# Patient Record
Sex: Female | Born: 1960 | Race: White | Hispanic: No | Marital: Single | Smoking: Current every day smoker
Health system: Southern US, Community
[De-identification: ages and names within clinical notes are randomized; demographics above are authoritative.]

## PROBLEM LIST (undated history)

## (undated) ENCOUNTER — Ambulatory Visit: Payer: Self-pay

## (undated) DIAGNOSIS — J439 Emphysema, unspecified: Secondary | ICD-10-CM

## (undated) DIAGNOSIS — I341 Nonrheumatic mitral (valve) prolapse: Secondary | ICD-10-CM

## (undated) DIAGNOSIS — I10 Essential (primary) hypertension: Secondary | ICD-10-CM

## (undated) HISTORY — PX: SHOULDER ARTHROSCOPY: SHX128

## (undated) HISTORY — PX: ABDOMINAL HYSTERECTOMY: SHX81

## (undated) HISTORY — PX: NECK SURGERY: SHX720

---

## 2014-08-18 ENCOUNTER — Emergency Department: Payer: Self-pay | Admitting: Student

## 2014-11-15 ENCOUNTER — Emergency Department: Payer: Self-pay | Admitting: Emergency Medicine

## 2014-12-15 ENCOUNTER — Emergency Department: Payer: Self-pay | Admitting: Emergency Medicine

## 2014-12-15 LAB — CBC
HCT: 42.2 % (ref 35.0–47.0)
HGB: 14.4 g/dL (ref 12.0–16.0)
MCH: 31.5 pg (ref 26.0–34.0)
MCHC: 34 g/dL (ref 32.0–36.0)
MCV: 93 fL (ref 80–100)
PLATELETS: 223 10*3/uL (ref 150–440)
RBC: 4.55 10*6/uL (ref 3.80–5.20)
RDW: 12.7 % (ref 11.5–14.5)
WBC: 13.9 10*3/uL — ABNORMAL HIGH (ref 3.6–11.0)

## 2014-12-15 LAB — COMPREHENSIVE METABOLIC PANEL
ALT: 22 U/L (ref 14–63)
Albumin: 4 g/dL (ref 3.4–5.0)
Alkaline Phosphatase: 54 U/L (ref 46–116)
Anion Gap: 8 (ref 7–16)
BILIRUBIN TOTAL: 0.4 mg/dL (ref 0.2–1.0)
BUN: 17 mg/dL (ref 7–18)
CHLORIDE: 106 mmol/L (ref 98–107)
CREATININE: 0.69 mg/dL (ref 0.60–1.30)
Calcium, Total: 9.4 mg/dL (ref 8.5–10.1)
Co2: 26 mmol/L (ref 21–32)
EGFR (Non-African Amer.): >60
Glucose: 100 mg/dL — ABNORMAL HIGH (ref 65–99)
OSMOLALITY: 281 (ref 275–301)
Potassium: 3.8 mmol/L (ref 3.5–5.1)
SGOT(AST): 14 U/L — ABNORMAL LOW (ref 15–37)
Sodium: 140 mmol/L (ref 136–145)
Total Protein: 8.3 g/dL — ABNORMAL HIGH (ref 6.4–8.2)

## 2014-12-15 LAB — TROPONIN I: Troponin-I: <0.02 ng/mL

## 2014-12-15 LAB — D-DIMER(ARMC): D-DIMER: 276 ng/mL

## 2015-06-20 ENCOUNTER — Encounter: Payer: Self-pay | Admitting: Emergency Medicine

## 2015-06-20 ENCOUNTER — Emergency Department
Admission: EM | Admit: 2015-06-20 | Discharge: 2015-06-20 | Disposition: A | Discharge status: Home / Self Care | Payer: Self-pay | Attending: Emergency Medicine | Admitting: Emergency Medicine

## 2015-06-20 DIAGNOSIS — Y998 Other external cause status: Secondary | ICD-10-CM | POA: Insufficient documentation

## 2015-06-20 DIAGNOSIS — Z720 Tobacco use: Secondary | ICD-10-CM | POA: Insufficient documentation

## 2015-06-20 DIAGNOSIS — X58XXXA Exposure to other specified factors, initial encounter: Secondary | ICD-10-CM | POA: Insufficient documentation

## 2015-06-20 DIAGNOSIS — R110 Nausea: Secondary | ICD-10-CM | POA: Insufficient documentation

## 2015-06-20 DIAGNOSIS — Y9389 Activity, other specified: Secondary | ICD-10-CM | POA: Insufficient documentation

## 2015-06-20 DIAGNOSIS — R51 Headache: Secondary | ICD-10-CM | POA: Insufficient documentation

## 2015-06-20 DIAGNOSIS — Y9289 Other specified places as the place of occurrence of the external cause: Secondary | ICD-10-CM | POA: Insufficient documentation

## 2015-06-20 DIAGNOSIS — S161XXA Strain of muscle, fascia and tendon at neck level, initial encounter: Secondary | ICD-10-CM | POA: Insufficient documentation

## 2015-06-20 HISTORY — DX: Nonrheumatic mitral (valve) prolapse: I34.1

## 2015-06-20 HISTORY — DX: Emphysema, unspecified: J43.9

## 2015-06-20 MED ORDER — KETOROLAC TROMETHAMINE 60 MG/2ML IM SOLN
60.0000 mg | Freq: Once | INTRAMUSCULAR | Status: AC
  Administered 2015-06-20: 60 mg via INTRAMUSCULAR
  Filled 2015-06-20: qty 2

## 2015-06-20 MED ORDER — DIAZEPAM 5 MG/ML IJ SOLN
5.0000 mg | Freq: Once | INTRAMUSCULAR | Status: AC
  Administered 2015-06-20: 5 mg via INTRAMUSCULAR
  Filled 2015-06-20: qty 2

## 2015-06-20 MED ORDER — PROMETHAZINE HCL 25 MG/ML IJ SOLN: 12.5000 mg | Freq: Once | INTRAMUSCULAR | Status: DC

## 2015-06-20 MED ORDER — IBUPROFEN 800 MG PO TABS: 800.0000 mg | ORAL_TABLET | Freq: Three times a day (TID) | ORAL | Status: AC | PRN

## 2015-06-20 MED ORDER — MEPERIDINE HCL 25 MG/ML IJ SOLN: 25.0000 mg | Freq: Once | INTRAMUSCULAR | Status: DC

## 2015-06-20 MED ORDER — CYCLOBENZAPRINE HCL 10 MG PO TABS: 10.0000 mg | ORAL_TABLET | Freq: Three times a day (TID) | ORAL | Status: DC | PRN

## 2015-06-20 NOTE — ED Notes (Signed)
Patient has a history of neck surgery and gets frequent massages by family.  Patient states she may have slept on it wrong but has had increased pain.  Difficulty moving neck to the right side.  Nausea and headache also.  No vision changes.

## 2015-06-20 NOTE — ED Provider Notes (Signed)
[facility] Emergency Department Provider Note  ____________________________________________  Time seen: Approximately 4:10 PM  I have reviewed the triage vital signs and the nursing notes.   HISTORY  Chief Complaint Neck Pain   HPI Sylvia Kirk is a 54 y.o. female resents for evaluation of severe neck pain. Patient states that she thinks that she slept wrong woke up this morning with pain to the right side. Also complains of nausea and headache. Denies any visual changes. Past medical history significant for cervical spinal fusion.   Past Medical History  Diagnosis Date  . Emphysema lung   . Mitral valve prolapse     There are no active problems to display for this patient.   Past Surgical History  Procedure Laterality Date  . Neck surgery    . Shoulder arthroscopy      right  . Abdominal hysterectomy      Current Outpatient Rx  Name  Route  Sig  Dispense  Refill  . cyclobenzaprine (FLEXERIL) 10 MG tablet   Oral   Take 1 tablet (10 mg total) by mouth every 8 (eight) hours as needed for muscle spasms.   30 tablet   1   . ibuprofen (ADVIL,MOTRIN) 800 MG tablet   Oral   Take 1 tablet (800 mg total) by mouth every 8 (eight) hours as needed.   30 tablet   0     Allergies Codeine  History reviewed. No pertinent family history.  Social History History  Substance Use Topics  . Smoking status: Current Every Day Smoker -- 0.50 packs/day    Types: Cigarettes  . Smokeless tobacco: Never Used  . Alcohol Use: No    Review of Systems Constitutional: No fever/chills Eyes: No visual changes. ENT: No sore throat. Cardiovascular: Denies chest pain. Respiratory: Denies shortness of breath. Gastrointestinal: No abdominal pain.  No nausea, no vomiting.  No diarrhea.  No constipation. Genitourinary: Negative for dysuria. Musculoskeletal: Positive for cervical spinal neck pain. Skin: Negative for rash. Neurological: Negative for  headaches, focal weakness or numbness.  10-point ROS otherwise negative.  ____________________________________________   PHYSICAL EXAM:  VITAL SIGNS: ED Triage Vitals  Enc Vitals Group     BP 06/20/15 1552 141/84 mmHg     Pulse Rate 06/20/15 1552 72     Resp 06/20/15 1552 18     Temp 06/20/15 1552 98.1 F (36.7 C)     Temp Source 06/20/15 1552 Oral     SpO2 06/20/15 1552 99 %     Weight 06/20/15 1552 135 lb (61.236 kg)     Height 06/20/15 1552  (1.727 m)     Head Cir --      Peak Flow --      Pain Score 06/20/15 1551 10     Pain Loc --      Pain Edu? --      Excl. in GC? --     Constitutional: Alert and oriented. Well appearing and in no acute distress. Mouth/Throat: Mucous membranes are moist.  Oropharynx non-erythematous. Neck: Positive cervical paraspinal muscle tenderness with limited range of motion. Able to lateralize head but unable to flex or extend.   Cardiovascular: Normal rate, regular rhythm. Grossly normal heart sounds.  Good peripheral circulation. Respiratory: Normal respiratory effort.  No retractions. Lungs CTAB. Musculoskeletal: No lower extremity tenderness nor edema.  No joint effusions. Neurologic:  Normal speech and language. No gross focal neurologic deficits are appreciated. No gait instability. Skin:  Skin is warm,  dry and intact. No rash noted. Psychiatric: Mood and affect are normal. Speech and behavior are normal.  ____________________________________________   LABS (all labs ordered are listed, but only abnormal results are displayed)  Labs Reviewed - No data to display ____________________________________________   PROCEDURES  Procedure(s) performed: None  Critical Care performed: No  ____________________________________________   INITIAL IMPRESSION / ASSESSMENT AND PLAN / ED COURSE  Pertinent labs & imaging results that were available during my care of the patient were reviewed by me and considered in my medical decision  making (see chart for details).  History cervical spinal fusion with paraspinal muscle tenderness today. Toradol 60 Valium 5 mg IM given Rx given for Flexeril 10 mg 3 times a day and Motrin 800 mg 3 times a day. Patient follow-up with PCP as needed or return to the ER. ____________________________________________   FINAL CLINICAL IMPRESSION(S) / ED DIAGNOSES  Final diagnoses:  Cervical strain, acute, initial encounter      Evangeline Dakin, PA-C 06/20/15 1648  Maurilio Lovely, MD 06/21/15 0005

## 2015-06-20 NOTE — Discharge Instructions (Signed)

## 2015-09-28 ENCOUNTER — Emergency Department: Payer: Self-pay

## 2015-09-28 ENCOUNTER — Encounter: Payer: Self-pay | Admitting: Emergency Medicine

## 2015-09-28 ENCOUNTER — Emergency Department
Admission: EM | Admit: 2015-09-28 | Discharge: 2015-09-28 | Disposition: A | Discharge status: Home / Self Care | Payer: Self-pay | Attending: Emergency Medicine | Admitting: Emergency Medicine

## 2015-09-28 DIAGNOSIS — J209 Acute bronchitis, unspecified: Secondary | ICD-10-CM | POA: Insufficient documentation

## 2015-09-28 DIAGNOSIS — F17210 Nicotine dependence, cigarettes, uncomplicated: Secondary | ICD-10-CM | POA: Insufficient documentation

## 2015-09-28 DIAGNOSIS — J069 Acute upper respiratory infection, unspecified: Secondary | ICD-10-CM | POA: Insufficient documentation

## 2015-09-28 DIAGNOSIS — J40 Bronchitis, not specified as acute or chronic: Secondary | ICD-10-CM

## 2015-09-28 MED ORDER — AZITHROMYCIN 250 MG PO TABS: 250.0000 mg | ORAL_TABLET | Freq: Every day | ORAL | Status: AC

## 2015-09-28 MED ORDER — ALBUTEROL SULFATE HFA 108 (90 BASE) MCG/ACT IN AERS: 2.0000 | INHALATION_SPRAY | Freq: Four times a day (QID) | RESPIRATORY_TRACT | Status: AC | PRN

## 2015-09-28 MED ORDER — AZITHROMYCIN 250 MG PO TABS
500.0000 mg | ORAL_TABLET | Freq: Once | ORAL | Status: AC
  Administered 2015-09-28: 500 mg via ORAL
  Filled 2015-09-28: qty 2

## 2015-09-28 MED ORDER — DEXAMETHASONE 4 MG PO TABS
8.0000 mg | ORAL_TABLET | Freq: Once | ORAL | Status: AC
  Administered 2015-09-28: 8 mg via ORAL
  Filled 2015-09-28: qty 2

## 2015-09-28 NOTE — ED Notes (Signed)
Pt in today reporting productive cough, fever x 3 weeks; states symptoms got better and then worse again.  Pt reports taking mucinex. Pt A/Ox4, no acute distress at this time. MD at bedside.

## 2015-09-28 NOTE — Discharge Instructions (Signed)
Upper Respiratory Infection, Adult Most upper respiratory infections (URIs) are a viral infection of the air passages leading to the lungs. A URI affects the nose, throat, and upper air passages. The most common type of URI is nasopharyngitis and is typically referred to as "the common cold." URIs run their course and usually go away on their own. Most of the time, a URI does not require medical attention, but sometimes a bacterial infection in the upper airways can follow a viral infection. This is called a secondary infection. Sinus and middle ear infections are common types of secondary upper respiratory infections. Bacterial pneumonia can also complicate a URI. A URI can worsen asthma and chronic obstructive pulmonary disease (COPD). Sometimes, these complications can require emergency medical care and may be life threatening.  CAUSES Almost all URIs are caused by viruses. A virus is a type of germ and can spread from one person to another.  RISKS FACTORS You may be at risk for a URI if:   You smoke.   You have chronic heart or lung disease.  You have a weakened defense (immune) system.   You are very young or very old.   You have nasal allergies or asthma.  You work in crowded or poorly ventilated areas.  You work in health care facilities or schools. SIGNS AND SYMPTOMS  Symptoms typically develop 2-3 days after you come in contact with a cold virus. Most viral URIs last 7-10 days. However, viral URIs from the influenza virus (flu virus) can last 14-18 days and are typically more severe. Symptoms may include:   Runny or stuffy (congested) nose.   Sneezing.   Cough.   Sore throat.   Headache.   Fatigue.   Fever.   Loss of appetite.   Pain in your forehead, behind your eyes, and over your cheekbones (sinus pain).  Muscle aches.  DIAGNOSIS  Your health care provider may diagnose a URI by:  Physical exam.  Tests to check that your symptoms are not due to  another condition such as:  Strep throat.  Sinusitis.  Pneumonia.  Asthma. TREATMENT  A URI goes away on its own with time. It cannot be cured with medicines, but medicines may be prescribed or recommended to relieve symptoms. Medicines may help:  Reduce your fever.  Reduce your cough.  Relieve nasal congestion. HOME CARE INSTRUCTIONS   Take medicines only as directed by your health care provider.   Gargle warm saltwater or take cough drops to comfort your throat as directed by your health care provider.  Use a warm mist humidifier or inhale steam from a shower to increase air moisture. This may make it easier to breathe.  Drink enough fluid to keep your urine clear or pale yellow.   Eat soups and other clear broths and maintain good nutrition.   Rest as needed.   Return to work when your temperature has returned to normal or as your health care provider advises. You may need to stay home longer to avoid infecting others. You can also use a face mask and careful hand washing to prevent spread of the virus.  Increase the usage of your inhaler if you have asthma.   Do not use any tobacco products, including cigarettes, chewing tobacco, or electronic cigarettes. If you need help quitting, ask your health care provider. PREVENTION  The best way to protect yourself from getting a cold is to practice good hygiene.   Avoid oral or hand contact with people with cold   symptoms.   Wash your hands often if contact occurs.  There is no clear evidence that vitamin C, vitamin E, echinacea, or exercise reduces the chance of developing a cold. However, it is always recommended to get plenty of rest, exercise, and practice good nutrition.  SEEK MEDICAL CARE IF:   You are getting worse rather than better.   Your symptoms are not controlled by medicine.   You have chills.  You have worsening shortness of breath.  You have brown or red mucus.  You have yellow or brown nasal  discharge.  You have pain in your face, especially when you bend forward.  You have a fever.  You have swollen neck glands.  You have pain while swallowing.  You have white areas in the back of your throat. SEEK IMMEDIATE MEDICAL CARE IF:   You have severe or persistent:  Headache.  Ear pain.  Sinus pain.  Chest pain.  You have chronic lung disease and any of the following:  Wheezing.  Prolonged cough.  Coughing up blood.  A change in your usual mucus.  You have a stiff neck.  You have changes in your:  Vision.  Hearing.  Thinking.  Mood. MAKE SURE YOU:   Understand these instructions.  Will watch your condition.  Will get help right away if you are not doing well or get worse.   This information is not intended to replace advice given to you by your health care provider. Make sure you discuss any questions you have with your health care provider.   Document Released: 04/24/2001 Document Revised: 03/15/2015 Document Reviewed: 02/03/2014 Elsevier Interactive Patient Education 2016 [employer]  

## 2015-09-28 NOTE — ED Provider Notes (Signed)
[facility] Emergency Department Provider Note  Time seen: 10:11 AM  I have reviewed the triage vital signs and the nursing notes.   HISTORY  Chief Complaint Cough and Chest Pain    HPI Geneen Nahjae Hoeg is a 54 y.o. female with a past medical history of emphysema presents to the emergency department with cough, congestion, subjective fever 3 weeks. According to the patient for the past 3 weeks she has intermittent cough, congestion, with subjective fevers mostly at night. States the cough has been nonstop. She is taking Mucinex without relief. States occasional chest pain with her cough but none at rest. Has not measured a temperature at home. States her throat is hurting from coughing. Has nasal congestion.Describes her symptoms as moderate.    Past Medical History  Diagnosis Date  . Emphysema lung (HCC)   . Mitral valve prolapse     There are no active problems to display for this patient.   Past Surgical History  Procedure Laterality Date  . Neck surgery    . Shoulder arthroscopy      right  . Abdominal hysterectomy      Current Outpatient Rx  Name  Route  Sig  Dispense  Refill  . cyclobenzaprine (FLEXERIL) 10 MG tablet   Oral   Take 1 tablet (10 mg total) by mouth every 8 (eight) hours as needed for muscle spasms.   30 tablet   1   . ibuprofen (ADVIL,MOTRIN) 800 MG tablet   Oral   Take 1 tablet (800 mg total) by mouth every 8 (eight) hours as needed.   30 tablet   0     Allergies Codeine  No family history on file.  Social History Social History  Substance Use Topics  . Smoking status: Current Every Day Smoker -- 0.50 packs/day    Types: Cigarettes  . Smokeless tobacco: Never Used  . Alcohol Use: No    Review of Systems Constitutional: Subjective fever. Positive nasal congestion. Cardiovascular: Occasional chest pain with coughing spells. Respiratory: Negative for shortness of breath. Positive  cough. Gastrointestinal: Negative for abdominal pain Musculoskeletal: Negative for back pain Neurological: Negative for headache 10-point ROS otherwise negative.  ____________________________________________   PHYSICAL EXAM:  VITAL SIGNS: ED Triage Vitals  Enc Vitals Group     BP 09/28/15 0957 154/92 mmHg     Pulse Rate 09/28/15 0957 86     Resp 09/28/15 0957 18     Temp 09/28/15 0957 98.2 F (36.8 C)     Temp Source 09/28/15 0957 Oral     SpO2 09/28/15 0957 97 %     Weight 09/28/15 0957 130 lb (58.968 kg)     Height 09/28/15 0957  (1.727 m)     Head Cir --      Peak Flow --      Pain Score 09/28/15 0957 9     Pain Loc --      Pain Edu? --      Excl. in GC? --     Constitutional: Alert and oriented. Well appearing and in no distress. Eyes: Normal exam ENT   Head: Normocephalic and atraumatic.   Nose: Mild rhinorrhea. Normal tympanic membranes.   Mouth/Throat: Mucous membranes are moist. No pharyngeal erythema or exudate. Cardiovascular: Normal rate, regular rhythm. No murmurs, rubs, or gallops. Respiratory: Normal respiratory effort without tachypnea nor retractions. Breath sounds are clear and equal bilaterally. No wheezes/rales/rhonchi. Gastrointestinal: Soft and nontender. No distention.   Musculoskeletal: Nontender with normal  range of motion in all extremities Neurologic:  Normal speech and language. No gross focal neurologic deficits Skin:  Skin is warm, dry and intact.  Psychiatric: Mood and affect are normal. Speech and behavior are normal.  ____________________________________________      RADIOLOGY  Bronchitic changes  ____________________________________________    INITIAL IMPRESSION / ASSESSMENT AND PLAN / ED COURSE  Pertinent labs & imaging results that were available during my care of the patient were reviewed by me and considered in my medical decision making (see chart for details).  Patient presents with 3 weeks of  intermittent cough, congestion, sore throat. Symptoms most suggestive of bronchitis given the patient's persistent dry cough, with history of emphysema. No wheeze currently. We'll obtain a chest x-ray to rule out pneumonia. We will treat with Decadron (patient cannot afford prednisone prescription per patient). We will also prescribe Zithromax. We will prescribe albuterol, however the patient states she'll not be able to afford albuterol until Thursday. Overall the patient appears very well, nontoxic. No concern for ACS. Patient is suffering from an upper respiratory infection.  Chest x-ray shows bronchitic changes, otherwise no acute abnormality. We'll discharge on Zithromax and have the patient follow up with a primary care physician.  ____________________________________________   FINAL CLINICAL IMPRESSION(S) / ED DIAGNOSES  Upper respiratory infection   Minna AntisKevin Kallon Caylor, MD 09/28/15 1058

## 2015-09-28 NOTE — ED Notes (Signed)
Says she has had cough, copd and her chst hurts from coughing and beind sick so longl

## 2015-10-03 ENCOUNTER — Ambulatory Visit: Payer: Self-pay | Admitting: Unknown Physician Specialty

## 2016-03-18 ENCOUNTER — Emergency Department
Admission: EM | Admit: 2016-03-18 | Discharge: 2016-03-18 | Disposition: A | Discharge status: Home / Self Care | Payer: Self-pay | Attending: Emergency Medicine | Admitting: Emergency Medicine

## 2016-03-18 ENCOUNTER — Encounter: Payer: Self-pay | Admitting: *Deleted

## 2016-03-18 DIAGNOSIS — I10 Essential (primary) hypertension: Secondary | ICD-10-CM | POA: Insufficient documentation

## 2016-03-18 DIAGNOSIS — M62838 Other muscle spasm: Secondary | ICD-10-CM | POA: Insufficient documentation

## 2016-03-18 DIAGNOSIS — M25512 Pain in left shoulder: Secondary | ICD-10-CM | POA: Insufficient documentation

## 2016-03-18 DIAGNOSIS — M7582 Other shoulder lesions, left shoulder: Secondary | ICD-10-CM | POA: Insufficient documentation

## 2016-03-18 DIAGNOSIS — G8929 Other chronic pain: Secondary | ICD-10-CM | POA: Insufficient documentation

## 2016-03-18 DIAGNOSIS — F17210 Nicotine dependence, cigarettes, uncomplicated: Secondary | ICD-10-CM | POA: Insufficient documentation

## 2016-03-18 HISTORY — DX: Essential (primary) hypertension: I10

## 2016-03-18 MED ORDER — PREDNISONE 10 MG PO TABS: 10.0000 mg | ORAL_TABLET | Freq: Two times a day (BID) | ORAL | Status: AC

## 2016-03-18 MED ORDER — CYCLOBENZAPRINE HCL 5 MG PO TABS: 5.0000 mg | ORAL_TABLET | Freq: Three times a day (TID) | ORAL | Status: AC | PRN

## 2016-03-18 NOTE — ED Provider Notes (Signed)
[facility] Emergency Department Provider Note ____________________________________________  Time seen: 1757  I have reviewed the triage vital signs and the nursing notes.  HISTORY  Chief Complaint  Shoulder Pain  HPI Sylvia Kirk is a 55 y.o. female visits to the ED for evaluation and management of chronic pain to the left shoulder without recent injury, accident, or trauma. The patient describes muscle spasm to the posterior shoulder and trapezius region that has been persistent for the last year and a half. Review of her EPIC chart reveals an MRI done in 2012 of the left shoulder that reveals bursitis and tendinosis of the rotator cuff without acute tear. Her more recent evaluations include a slip and fall in September 2016 when she sustained a left wrist fracture that was treated without surgical intervention. She had a subsequent nerve blocks the left upper extremity related to that injury. She has not been according to her, evaluated by any provider since that time the left shoulder. She is previously been on medications including Flexeril and meloxicam. She describes the pain as shooting in nature with decreased range of motion to the left shoulder. Denies any distal paresthesias, grip changes, or recent injury. She rates her pain as 9/10 in triage.  Past Medical History  Diagnosis Date  . Emphysema lung (HCC)   . Mitral valve prolapse   . Hypertension     There are no active problems to display for this patient.   Past Surgical History  Procedure Laterality Date  . Neck surgery    . Shoulder arthroscopy      right  . Abdominal hysterectomy      Current Outpatient Rx  Name  Route  Sig  Dispense  Refill  . albuterol (PROVENTIL HFA;VENTOLIN HFA) 108 (90 BASE) MCG/ACT inhaler   Inhalation   Inhale 2 puffs into the lungs every 6 (six) hours as needed for wheezing or shortness of breath.   1 Inhaler   2   . azithromycin (ZITHROMAX) 250 MG  tablet   Oral   Take 1 tablet (250 mg total) by mouth daily.   4 each   0   . cyclobenzaprine (FLEXERIL) 5 MG tablet   Oral   Take 1 tablet (5 mg total) by mouth every 8 (eight) hours as needed for muscle spasms.   21 tablet   0   . ibuprofen (ADVIL,MOTRIN) 800 MG tablet   Oral   Take 1 tablet (800 mg total) by mouth every 8 (eight) hours as needed.   30 tablet   0   . predniSONE (DELTASONE) 10 MG tablet   Oral   Take 1 tablet (10 mg total) by mouth 2 (two) times daily with a meal.   14 tablet   0    Allergies Codeine  History reviewed. No pertinent family history.  Social History Social History  Substance Use Topics  . Smoking status: Current Every Day Smoker -- 0.50 packs/day    Types: Cigarettes  . Smokeless tobacco: Never Used  . Alcohol Use: No   Review of Systems  Constitutional: Negative for fever. Cardiovascular: Negative for chest pain. Respiratory: Negative for shortness of breath. Musculoskeletal: Negative for back pain. Left shoulder pain as above Skin: Negative for rash. Neurological: Negative for headaches, focal weakness or numbness. ____________________________________________  PHYSICAL EXAM:  VITAL SIGNS: ED Triage Vitals  Enc Vitals Group     BP 03/18/16 1637 183/77 mmHg     Pulse Rate 03/18/16 1637 87  Resp 03/18/16 1637 16     Temp 03/18/16 1637 98.2 F (36.8 C)     Temp Source 03/18/16 1637 Oral     SpO2 03/18/16 1637 97 %     Weight 03/18/16 1637 130 lb (58.968 kg)     Height 03/18/16 1637  (1.727 m)     Head Cir --      Peak Flow --      Pain Score 03/18/16 1639 9     Pain Loc --      Pain Edu? --      Excl. in GC? --    Constitutional: Alert and oriented. Well appearing and in no distress. Head: Normocephalic and atraumatic. Cardiovascular: Normal rate, regular rhythm. Normal distal pulses and cap refill. Respiratory: Normal respiratory effort. No wheezes/rales/rhonchi. Musculoskeletal: Left shoulder without  obvious deformity, no sulcus sign, or dislocation. Patient is able to demonstrate normal active range of motion in all planes. She is noted to have a negative drop arm test, normal Apley scratch test, and normal rotator cuff testing. Nontender with normal range of motion in all other extremities.  Neurologic: Cranial nerves II through XII grossly intact. Normal UE DTRs bilaterally. Normal gait without ataxia. Normal speech and language. No gross focal neurologic deficits are appreciated. Skin:  Skin is warm, dry and intact. No rash noted. ____________________________________________  INITIAL IMPRESSION / ASSESSMENT AND PLAN / ED COURSE  Patient with chronic left shoulder pain with out recent injury, accident, trauma. She has a history of rotator cuff tendinitis/tendinosis, as well as bursitis. She is advised that she should be evaluated by orthopedics for ongoing management of her chronic shoulder pain. She is given referral to Dr. Rosita Kea as well as a referral to the local community clinics to establish a primary care home. She is discharged with prescriptions for Flexeril and Deltasone dose as directed. She is advised to apply ice to the shoulder for comfort and advised on home exercises. ____________________________________________  FINAL CLINICAL IMPRESSION(S) / ED DIAGNOSES  Final diagnoses:  Chronic shoulder pain, left  Rotator cuff tendinitis, left  Muscle spasm of left shoulder area     Lissa Hoard, PA-C 03/19/16 0981  Jennye Moccasin, MD 03/22/16 [phone]

## 2016-03-18 NOTE — ED Notes (Signed)
Patient transported to X-ray 

## 2016-03-18 NOTE — ED Notes (Signed)
Pt reports left shoulder pain without specific injury for past few months. Pt states has had steroid shots in shoulder without relief of pain. Pt reports continued worsening symptoms.

## 2016-03-18 NOTE — Discharge Instructions (Signed)
Chronic Pain Chronic pain can be defined as pain that is off and on and lasts for 3-6 months or longer. Many things cause chronic pain, which can make it difficult to make a diagnosis. There are many treatment options available for chronic pain. However, finding a treatment that works well for you may require trying various approaches until the right one is found. Many people benefit from a combination of two or more types of treatment to control their pain. SYMPTOMS  Chronic pain can occur anywhere in the body and can range from mild to very severe. Some types of chronic pain include: 1. Headache. 2. Low back pain. 3. Cancer pain. 4. Arthritis pain. 5. Neurogenic pain. This is pain resulting from damage to nerves. People with chronic pain may also have other symptoms such as: 1. Depression. 2. Anger. 3. Insomnia. 4. Anxiety. DIAGNOSIS  Your health care provider will help diagnose your condition over time. In many cases, the initial focus will be on excluding possible conditions that could be causing the pain. Depending on your symptoms, your health care provider may order tests to diagnose your condition. Some of these tests may include:  1. Blood tests.  2. CT scan.  3. MRI.  4. X-rays.  5. Ultrasounds.  6. Nerve conduction studies.  You may need to see a specialist.  TREATMENT  Finding treatment that works well may take time. You may be referred to a pain specialist. He or she may prescribe medicine or therapies, such as:  1. Mindful meditation or yoga. 2. Shots (injections) of numbing or pain-relieving medicines into the spine or area of pain. 3. Local electrical stimulation. 4. Acupuncture.  5. Massage therapy.  6. Aroma, color, light, or sound therapy.  7. Biofeedback.  8. Working with a physical therapist to keep from getting stiff.  9. Regular, gentle exercise.  10. Cognitive or behavioral therapy.  11. Group support.  Sometimes, surgery may be recommended.    HOME CARE INSTRUCTIONS  1. Take all medicines as directed by your health care provider.  2. Lessen stress in your life by relaxing and doing things such as listening to calming music.  3. Exercise or be active as directed by your health care provider.  4. Eat a healthy diet and include things such as vegetables, fruits, fish, and lean meats in your diet.  5. Keep all follow-up appointments with your health care provider.  6. Attend a support group with others suffering from chronic pain. SEEK MEDICAL CARE IF:  1. Your pain gets worse.  2. You develop a new pain that was not there before.  3. You cannot tolerate medicines given to you by your health care provider.  4. You have new symptoms since your last visit with your health care provider.  SEEK IMMEDIATE MEDICAL CARE IF:  1. You feel weak.  2. You have decreased sensation or numbness.  3. You lose control of bowel or bladder function.  4. Your pain suddenly gets much worse.  5. You develop shaking. 6. You develop chills. 7. You develop confusion. 8. You develop chest pain. 9. You develop shortness of breath.  MAKE SURE YOU:  Understand these instructions.  Will watch your condition.  Will get help right away if you are not doing well or get worse.   This information is not intended to replace advice given to you by your health care provider. Make sure you discuss any questions you have with your health care provider.   Document Released:  07/21/2002 Document Revised: 07/01/2013 Document Reviewed: 04/24/2013 Elsevier Interactive Patient Education 2016 [employer] Inc[employer]   Shoulder Pain The shoulder is the joint that connects your arms to your body. The bones that form the shoulder joint include the upper arm bone (humerus), the shoulder blade (scapula), and the collarbone (clavicle). The top of the humerus is shaped like a ball and fits into a rather flat socket on the scapula (glenoid cavity). A combination of  muscles and strong, fibrous tissues that connect muscles to bones (tendons) support your shoulder joint and hold the ball in the socket. Small, fluid-filled sacs (bursae) are located in different areas of the joint. They act as cushions between the bones and the overlying soft tissues and help reduce friction between the gliding tendons and the bone as you move your arm. Your shoulder joint allows a wide range of motion in your arm. This range of motion allows you to do things like scratch your back or throw a ball. However, this range of motion also makes your shoulder more prone to pain from overuse and injury. Causes of shoulder pain can originate from both injury and overuse and usually can be grouped in the following four categories: 6. Redness, swelling, and pain (inflammation) of the tendon (tendinitis) or the bursae (bursitis). 7. Instability, such as a dislocation of the joint. 8. Inflammation of the joint (arthritis). 9. Broken bone (fracture). HOME CARE INSTRUCTIONS  5. Apply ice to the sore area.  Put ice in a plastic bag.  Place a towel between your skin and the bag.  Leave the ice on for 15-20 minutes, 3-4 times per day for the first 2 days, or as directed by your health care provider. 6. Stop using cold packs if they do not help with the pain. 7. If you have a shoulder sling or immobilizer, wear it as long as your caregiver instructs. Only remove it to shower or bathe. Move your arm as little as possible, but keep your hand moving to prevent swelling. 8. Squeeze a soft ball or foam pad as much as possible to help prevent swelling. 9. Only take over-the-counter or prescription medicines for pain, discomfort, or fever as directed by your caregiver. SEEK MEDICAL CARE IF:  7. Your shoulder pain increases, or new pain develops in your arm, hand, or fingers. 8. Your hand or fingers become cold and numb. 9. Your pain is not relieved with medicines. SEEK IMMEDIATE MEDICAL CARE IF:   12. Your arm, hand, or fingers are numb or tingling. 13. Your arm, hand, or fingers are significantly swollen or turn white or blue. MAKE SURE YOU:  7. Understand these instructions. 8. Will watch your condition. 9. Will get help right away if you are not doing well or get worse.   This information is not intended to replace advice given to you by your health care provider. Make sure you discuss any questions you have with your health care provider.   Document Released: 08/08/2005 Document Revised: 11/19/2014 Document Reviewed: 02/21/2015 Elsevier Interactive Patient Education 2016 [employer]  Shoulder Range of Motion Exercises Shoulder range of motion (ROM) exercises are designed to keep the shoulder moving freely. They are often recommended for people who have shoulder pain. MOVEMENT EXERCISE When you are able, do this exercise 5-6 days per week, or as told by your health care provider. Work toward doing 2 sets of 10 swings. Pendulum Exercise How To Do This Exercise Lying Down 10. Lie face-down on a bed with your abdomen close  to the side of the bed. 11. Let your arm hang over the side of the bed. 12. Relax your shoulder, arm, and hand. 13. Slowly and gently swing your arm forward and back. Do not use your neck muscles to swing your arm. They should be relaxed. If you are struggling to swing your arm, have someone gently swing it for you. When you do this exercise for the first time, swing your arm at a 15 degree angle for 15 seconds, or swing your arm 10 times. As pain lessens over time, increase the angle of the swing to 30-45 degrees. 14. Repeat steps 1-4 with the other arm. How To Do This Exercise While Standing 10. Stand next to a sturdy chair or table and hold on to it with your hand.  Bend forward at the waist.  Bend your knees slightly.  Relax your other arm and let it hang limp.  Relax the shoulder blade of the arm that is hanging and let it drop.  While keeping  your shoulder relaxed, use body motion to swing your arm in small circles. The first time you do this exercise, swing your arm for about 30 seconds or 10 times. When you do it next time, swing your arm for a little longer.  Stand up tall and relax.  Repeat steps 1-7, this time changing the direction of the circles. 11. Repeat steps 1-8 with the other arm. STRETCHING EXERCISES Do these exercises 3-4 times per day on 5-6 days per week or as told by your health care provider. Work toward holding the stretch for 20 seconds. Stretching Exercise 1 10. Lift your arm straight out in front of you. 11. Bend your arm 90 degrees at the elbow (right angle) so your forearm goes across your body and looks like the letter "L." 12. Use your other arm to gently pull the elbow forward and across your body. 13. Repeat steps 1-3 with the other arm. Stretching Exercise 2 You will need a towel or rope for this exercise. 14. Bend one arm behind your back with the palm facing outward. 15. Hold a towel with your other hand. 16. Reach the arm that holds the towel above your head, and bend that arm at the elbow. Your wrist should be behind your neck. 17. Use your free hand to grab the free end of the towel. 18. With the higher hand, gently pull the towel up behind you. 19. With the lower hand, pull the towel down behind you. 20. Repeat steps 1-6 with the other arm. STRENGTHENING EXERCISES Do each of these exercises at four different times of day (sessions) every day or as told by your health care provider. To begin with, repeat each exercise 5 times (repetitions). Work toward doing 3 sets of 12 repetitions or as told by your health care provider. Strengthening Exercise 1 You will need a light weight for this activity. As you grow stronger, you may use a heavier weight. 10. Standing with a weight in your hand, lift your arm straight out to the side until it is at the same height as your shoulder. 11. Bend your arm at  90 degrees so that your fingers are pointing to the ceiling. 12. Slowly raise your hand until your arm is straight up in the air. 13. Repeat steps 1-3 with the other arm. Strengthening Exercise 2 You will need a light weight for this activity. As you grow stronger, you may use a heavier weight. 5. Standing with a weight in  your hand, gradually move your straight arm in an arc, starting at your side, then out in front of you, then straight up over your head. 6. Gradually move your other arm in an arc, starting at your side, then out in front of you, then straight up over your head. 7. Repeat steps 1-2 with the other arm. Strengthening Exercise 3 You will need an elastic band for this activity. As you grow stronger, gradually increase the size of the bands or increase the number of bands that you use at one time. 10. While standing, hold an elastic band in one hand and raise that arm up in the air. 11. With your other hand, pull down the band until that hand is by your side. 12. Repeat steps 1-2 with the other arm.   This information is not intended to replace advice given to you by your health care provider. Make sure you discuss any questions you have with your health care provider.   Document Released: 07/28/2003 Document Revised: 03/15/2015 Document Reviewed: 10/25/2014 Elsevier Interactive Patient Education [employer]   Your exam is consistent with shoulder strain and tendinitis. You should take the prescription meds as directed. Apply ice and follow-up with Dr. Rosita Kea as needed. Consider setting up primary care with one of the local community clinics as discussed.

## 2016-03-18 NOTE — ED Notes (Signed)
Pt to Ed reporting left shoulder pain for the past few months. Pt reports they have tried cortisone shots without relief. Pt denies trauma to the shoulder. Pt verbalized "I just can not take this pain anymore."  No decrease in mobility noted.

## 2016-04-27 ENCOUNTER — Emergency Department
Admission: EM | Admit: 2016-04-27 | Discharge: 2016-04-27 | Disposition: A | Discharge status: Home / Self Care | Payer: Self-pay | Attending: Emergency Medicine | Admitting: Emergency Medicine

## 2016-04-27 ENCOUNTER — Emergency Department: Payer: Self-pay

## 2016-04-27 DIAGNOSIS — Z79899 Other long term (current) drug therapy: Secondary | ICD-10-CM | POA: Insufficient documentation

## 2016-04-27 DIAGNOSIS — Z792 Long term (current) use of antibiotics: Secondary | ICD-10-CM | POA: Insufficient documentation

## 2016-04-27 DIAGNOSIS — I119 Hypertensive heart disease without heart failure: Secondary | ICD-10-CM | POA: Insufficient documentation

## 2016-04-27 DIAGNOSIS — F17210 Nicotine dependence, cigarettes, uncomplicated: Secondary | ICD-10-CM | POA: Insufficient documentation

## 2016-04-27 DIAGNOSIS — M25461 Effusion, right knee: Secondary | ICD-10-CM | POA: Insufficient documentation

## 2016-04-27 DIAGNOSIS — Z8679 Personal history of other diseases of the circulatory system: Secondary | ICD-10-CM | POA: Insufficient documentation

## 2016-04-27 MED ORDER — OXYCODONE-ACETAMINOPHEN 7.5-325 MG PO TABS: 1.0000 | ORAL_TABLET | ORAL | Status: AC | PRN

## 2016-04-27 MED ORDER — IBUPROFEN 600 MG PO TABS
600.0000 mg | ORAL_TABLET | Freq: Once | ORAL | Status: AC
  Administered 2016-04-27: 600 mg via ORAL
  Filled 2016-04-27: qty 1

## 2016-04-27 MED ORDER — OXYCODONE-ACETAMINOPHEN 5-325 MG PO TABS
1.0000 | ORAL_TABLET | Freq: Once | ORAL | Status: AC
  Administered 2016-04-27: 1 via ORAL
  Filled 2016-04-27: qty 1

## 2016-04-27 MED ORDER — NAPROXEN 500 MG PO TABS: 500.0000 mg | ORAL_TABLET | Freq: Two times a day (BID) | ORAL | Status: AC
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
## 2016-04-27 NOTE — ED Notes (Signed)
Pt arrives to ER via POV c/o left knee pain X 1 day; noticed while at work yesterday. Hx of chronic knee pain that intermittently pains her. Denies injury. Color WNL, no soreness to touch, no redness, no SOB. Pt alert and oriented X4, active, cooperative, pt in NAD. RR even and unlabored, color WNL.

## 2016-04-27 NOTE — ED Notes (Signed)
Knee immobilizer was placed on her right knee, 19in. She was given crutches and was able to walk with them without any issues.

## 2016-04-27 NOTE — Discharge Instructions (Signed)
Knee Effusion °Knee effusion means that you have extra fluid in your knee. This can cause pain. Your knee may be more difficult to bend and move. °HOME CARE °· Use crutches as told by your doctor. °· Wear a knee brace as told by your doctor. °· Apply ice to the swollen area: °¨ Put ice in a plastic bag. °¨ Place a towel between your skin and the bag. °¨ Leave the ice on for 20 minutes, 2-3 times per day. °· Keep your knee raised (elevated) when you are sitting or lying down. °· Take medicines only as told by your doctor. °· Do any rehabilitation or strengthening exercises as told by your doctor. °· Rest your knee as told by your doctor. You may start doing your normal activities again when your doctor says it is okay. °· Keep all follow-up visits as told by your doctor. This is important. °GET HELP IF:  °· You continue to have pain in your knee. °GET HELP RIGHT AWAY IF: °· You have increased swelling or redness of your knee. °· You have severe pain in your knee. °· You have a fever. °  °This information is not intended to replace advice given to you by your health care provider. Make sure you discuss any questions you have with your health care provider. °  °Document Released: 12/01/2010 Document Revised: 11/19/2014 Document Reviewed: 06/14/2014 °Elsevier Interactive Patient Education ©2016 Elsevier Inc. ° °

## 2016-04-27 NOTE — ED Notes (Signed)
No recent travel

## 2016-04-27 NOTE — ED Notes (Signed)
Pt verbalized understanding of discharge instructions. NAD at this time. 

## 2016-08-15 ENCOUNTER — Encounter: Payer: Self-pay | Admitting: Emergency Medicine

## 2016-08-15 ENCOUNTER — Emergency Department
Admission: EM | Admit: 2016-08-15 | Discharge: 2016-08-15 | Disposition: A | Discharge status: Home / Self Care | Payer: Self-pay | Attending: Emergency Medicine | Admitting: Emergency Medicine

## 2016-08-15 ENCOUNTER — Emergency Department: Payer: Self-pay

## 2016-08-15 DIAGNOSIS — I10 Essential (primary) hypertension: Secondary | ICD-10-CM | POA: Insufficient documentation

## 2016-08-15 DIAGNOSIS — F17210 Nicotine dependence, cigarettes, uncomplicated: Secondary | ICD-10-CM | POA: Insufficient documentation

## 2016-08-15 DIAGNOSIS — Z79899 Other long term (current) drug therapy: Secondary | ICD-10-CM | POA: Insufficient documentation

## 2016-08-15 DIAGNOSIS — M5416 Radiculopathy, lumbar region: Secondary | ICD-10-CM | POA: Insufficient documentation

## 2016-08-15 DIAGNOSIS — Z791 Long term (current) use of non-steroidal anti-inflammatories (NSAID): Secondary | ICD-10-CM | POA: Insufficient documentation

## 2016-08-15 DIAGNOSIS — M5410 Radiculopathy, site unspecified: Secondary | ICD-10-CM

## 2016-08-15 DIAGNOSIS — Z7952 Long term (current) use of systemic steroids: Secondary | ICD-10-CM | POA: Insufficient documentation

## 2016-08-15 DIAGNOSIS — Z792 Long term (current) use of antibiotics: Secondary | ICD-10-CM | POA: Insufficient documentation

## 2016-08-15 MED ORDER — METHOCARBAMOL 500 MG PO TABS
1000.0000 mg | ORAL_TABLET | Freq: Once | ORAL | Status: AC
  Administered 2016-08-15: 1000 mg via ORAL
  Filled 2016-08-15: qty 2

## 2016-08-15 MED ORDER — METHYLPREDNISOLONE 4 MG PO TBPK: ORAL_TABLET | ORAL | 0 refills | Status: AC

## 2016-08-15 MED ORDER — TRAMADOL HCL 50 MG PO TABS: 50.0000 mg | ORAL_TABLET | Freq: Four times a day (QID) | ORAL | 0 refills | Status: AC | PRN

## 2016-08-15 MED ORDER — CYCLOBENZAPRINE HCL 10 MG PO TABS: 10.0000 mg | ORAL_TABLET | Freq: Three times a day (TID) | ORAL | 0 refills | Status: AC | PRN

## 2016-08-15 MED ORDER — DEXAMETHASONE SODIUM PHOSPHATE 10 MG/ML IJ SOLN
10.0000 mg | Freq: Once | INTRAMUSCULAR | Status: AC
  Administered 2016-08-15: 10 mg via INTRAMUSCULAR
  Filled 2016-08-15: qty 1

## 2016-08-15 MED ORDER — HYDROMORPHONE HCL 1 MG/ML IJ SOLN
1.0000 mg | Freq: Once | INTRAMUSCULAR | Status: AC
  Administered 2016-08-15: 1 mg via INTRAMUSCULAR
  Filled 2016-08-15: qty 1

## 2016-08-15 NOTE — ED Triage Notes (Signed)
Pt reports back pain started yesterday gradually getting worse.  States pain to lower left back, numbness and tingling to left buttocks and leg.  Pt denies injury to back.

## 2016-08-15 NOTE — ED Provider Notes (Signed)
[facility] Emergency Department Provider Note   ____________________________________________   First MD Initiated Contact with Patient 08/15/16 1241     (approximate)  I have reviewed the triage vital signs and the nursing notes.   HISTORY  Chief Complaint Back Pain    HPI Sylvia Kirk is a 55 y.o. female patient complaining of radicular pain to the left lower extremity which started yesterday. Patient denies any provocative incident for this complaint. Patient rated the pain as a 10 over 10. Patient described a pain as a "shooting sharp". Patient denies any bladder or bowel dysfunction. No palliative measures taken for this complaint.   Past Medical History:  Diagnosis Date  . Emphysema lung (HCC)   . Emphysema lung (HCC)   . Hypertension   . Mitral valve prolapse     There are no active problems to display for this patient.   Past Surgical History:  Procedure Laterality Date  . ABDOMINAL HYSTERECTOMY    . CESAREAN SECTION    . NECK SURGERY    . SHOULDER ARTHROSCOPY     right    Prior to Admission medications   Medication Sig Start Date End Date Taking? Authorizing Provider  albuterol (PROVENTIL HFA;VENTOLIN HFA) 108 (90 BASE) MCG/ACT inhaler Inhale 2 puffs into the lungs every 6 (six) hours as needed for wheezing or shortness of breath. 09/28/15   Minna AntisKevin Paduchowski, MD  azithromycin (ZITHROMAX) 250 MG tablet Take 1 tablet (250 mg total) by mouth daily. 09/28/15   Minna AntisKevin Paduchowski, MD  cyclobenzaprine (FLEXERIL) 10 MG tablet Take 1 tablet (10 mg total) by mouth 3 (three) times daily as needed. 08/15/16   Joni Reiningonald K Smith, PA-C  cyclobenzaprine (FLEXERIL) 5 MG tablet Take 1 tablet (5 mg total) by mouth every 8 (eight) hours as needed for muscle spasms. 03/18/16   Jenise V Bacon Menshew, PA-C  ibuprofen (ADVIL,MOTRIN) 800 MG tablet Take 1 tablet (800 mg total) by mouth every 8 (eight) hours as needed. 06/20/15   Evangeline Dakinharles M Beers, PA-C    methylPREDNISolone (MEDROL DOSEPAK) 4 MG TBPK tablet Take Tapered dose as directed 08/15/16   Joni Reiningonald K Smith, PA-C  naproxen (NAPROSYN) 500 MG tablet Take 1 tablet (500 mg total) by mouth 2 (two) times daily with a meal. 04/27/16   Joni Reiningonald K Smith, PA-C  oxyCODONE-acetaminophen (PERCOCET) 7.5-325 MG tablet Take 1 tablet by mouth every 4 (four) hours as needed for severe pain. 04/27/16   Joni Reiningonald K Smith, PA-C  predniSONE (DELTASONE) 10 MG tablet Take 1 tablet (10 mg total) by mouth 2 (two) times daily with a meal. 03/18/16   Jenise V Bacon Menshew, PA-C  traMADol (ULTRAM) 50 MG tablet Take 1 tablet (50 mg total) by mouth every 6 (six) hours as needed. 08/15/16 08/15/17  Joni Reiningonald K Smith, PA-C    Allergies Codeine  History reviewed. No pertinent family history.  Social History Social History  Substance Use Topics  . Smoking status: Current Every Day Smoker    Packs/day: 0.50    Types: Cigarettes  . Smokeless tobacco: Never Used  . Alcohol use No    Review of Systems Constitutional: No fever/chills Eyes: No visual changes. ENT: No sore throat. Cardiovascular: Denies chest pain. Respiratory: Denies shortness of breath. Gastrointestinal: No abdominal pain.  No nausea, no vomiting.  No diarrhea.  No constipation. Genitourinary: Negative for dysuria. Musculoskeletal: Back pain Skin: Negative for rash. Neurological: Negative for headaches, focal weakness or numbness. Endocrine:Hypertension Hematological/Lymphatic: Allergic/Immunilogical: Codeine _________________________________________   PHYSICAL EXAM:  VITAL SIGNS: ED Triage Vitals  Enc Vitals Group     BP 08/15/16 1206 (!) 186/98     Pulse Rate 08/15/16 1206 100     Resp 08/15/16 1206 17     Temp 08/15/16 1206 97.7 F (36.5 C)     Temp Source 08/15/16 1206 Oral     SpO2 08/15/16 1206 99 %     Weight 08/15/16 1206 127 lb (57.6 kg)     Height 08/15/16 1206 5\' 8"  (1.727 m)     Head Circumference --      Peak Flow --      Pain  Score 08/15/16 1213 10     Pain Loc --      Pain Edu? --      Excl. in GC? --     Constitutional: Alert and oriented. Patient is crying and refused to sit down.  Eyes: Conjunctivae are normal. PERRL. EOMI. Head: Atraumatic. Nose: No congestion/rhinnorhea. Mouth/Throat: Mucous membranes are moist.  Oropharynx non-erythematous. Neck: No stridor.  No cervical spine tenderness to palpation. Hematological/Lymphatic/Immunilogical: No cervical lymphadenopathy. Cardiovascular: Normal rate, regular rhythm. Grossly normal heart sounds.  Good peripheral circulation. Respiratory: Normal respiratory effort.  No retractions. Lungs CTAB. Gastrointestinal: Soft and nontender. No distention. No abdominal bruits. No CVA tenderness. Musculoskeletal: No obvious spinal deformity. No CVA guarding. Patient moderate guarding palpation L4-S1. Neurologic:  Normal speech and language. No gross focal neurologic deficits are appreciated. No gait instability. Skin:  Skin is warm, dry and intact. No rash noted. Psychiatric: Mood and affect are normal. Speech and behavior are normal.  ____________________________________________   LABS (all labs ordered are listed, but only abnormal results are displayed)  Labs Reviewed - No data to display ____________________________________________  EKG   ____________________________________________  RADIOLOGY  No acute findings on x-ray of the lumbar spine. ____________________________________________   PROCEDURES  Procedure(s) performed: None  Procedures  Critical Care performed: No  ____________________________________________   INITIAL IMPRESSION / ASSESSMENT AND PLAN / ED COURSE  Pertinent labs & imaging results that were available during my care of the patient were reviewed by me and considered in my medical decision making (see chart for details).  Radicular back pain. Discussed negative x-ray finding with patient. Patient given discharge  Instructions. Patient given a prescription for tramadol, Medrol Dosepak, and Flexeril.  Clinical Course     ____________________________________________   FINAL CLINICAL IMPRESSION(S) / ED DIAGNOSES  Final diagnoses:  Radicular low back pain      NEW MEDICATIONS STARTED DURING THIS VISIT:  New Prescriptions   CYCLOBENZAPRINE (FLEXERIL) 10 MG TABLET    Take 1 tablet (10 mg total) by mouth 3 (three) times daily as needed.   METHYLPREDNISOLONE (MEDROL DOSEPAK) 4 MG TBPK TABLET    Take Tapered dose as directed   TRAMADOL (ULTRAM) 50 MG TABLET    Take 1 tablet (50 mg total) by mouth every 6 (six) hours as needed.     Note:  This document was prepared using Dragon voice recognition software and may include unintentional dictation errors.    Joni Reining, PA-C 08/15/16 1355    Joni Reining, PA-C 08/15/16 1359    Sharman Cheek, MD 08/15/16 [phone]

## 2016-08-15 NOTE — ED Notes (Signed)
Pt c/o lower to mid back pain - pain has been present for 2 days - denies and frequency or pain with urination - nothing makes the pain worse or better

## 2016-09-14 ENCOUNTER — Encounter: Payer: Self-pay | Admitting: Emergency Medicine

## 2016-09-14 ENCOUNTER — Emergency Department: Payer: Self-pay

## 2016-09-14 ENCOUNTER — Emergency Department
Admission: EM | Admit: 2016-09-14 | Discharge: 2016-09-14 | Disposition: A | Discharge status: Home / Self Care | Payer: Self-pay | Attending: Emergency Medicine | Admitting: Emergency Medicine

## 2016-09-14 DIAGNOSIS — Z79899 Other long term (current) drug therapy: Secondary | ICD-10-CM | POA: Insufficient documentation

## 2016-09-14 DIAGNOSIS — Z791 Long term (current) use of non-steroidal anti-inflammatories (NSAID): Secondary | ICD-10-CM | POA: Insufficient documentation

## 2016-09-14 DIAGNOSIS — I10 Essential (primary) hypertension: Secondary | ICD-10-CM | POA: Insufficient documentation

## 2016-09-14 DIAGNOSIS — F17210 Nicotine dependence, cigarettes, uncomplicated: Secondary | ICD-10-CM | POA: Insufficient documentation

## 2016-09-14 DIAGNOSIS — R51 Headache: Secondary | ICD-10-CM | POA: Insufficient documentation

## 2016-09-14 LAB — COMPREHENSIVE METABOLIC PANEL
ALBUMIN: 4.3 g/dL (ref 3.5–5.0)
ALK PHOS: 56 U/L (ref 38–126)
ALT: 19 U/L (ref 14–54)
AST: 27 U/L (ref 15–41)
Anion gap: 9 (ref 5–15)
BUN: 16 mg/dL (ref 6–20)
CALCIUM: 9.5 mg/dL (ref 8.9–10.3)
CO2: 28 mmol/L (ref 22–32)
CREATININE: 0.64 mg/dL (ref 0.44–1.00)
Chloride: 102 mmol/L (ref 101–111)
GFR calc Af Amer: >60 mL/min (ref >60)
GFR calc non Af Amer: >60 mL/min (ref >60)
GLUCOSE: 119 mg/dL — AB (ref 65–99)
Potassium: 4 mmol/L (ref 3.5–5.1)
SODIUM: 139 mmol/L (ref 135–145)
Total Bilirubin: 0.5 mg/dL (ref 0.3–1.2)
Total Protein: 8.2 g/dL — ABNORMAL HIGH (ref 6.5–8.1)

## 2016-09-14 LAB — CBC WITH DIFFERENTIAL/PLATELET
BASOS PCT: 0 %
Basophils Absolute: 0 10*3/uL (ref 0–0.1)
EOS ABS: 0.2 10*3/uL (ref 0–0.7)
EOS PCT: 2 %
HEMATOCRIT: 40.3 % (ref 35.0–47.0)
Hemoglobin: 13.8 g/dL (ref 12.0–16.0)
Lymphocytes Relative: 35 %
Lymphs Abs: 3.5 10*3/uL (ref 1.0–3.6)
MCH: 32.1 pg (ref 26.0–34.0)
MCHC: 34.2 g/dL (ref 32.0–36.0)
MCV: 93.7 fL (ref 80.0–100.0)
MONO ABS: 0.7 10*3/uL (ref 0.2–0.9)
MONOS PCT: 7 %
Neutro Abs: 5.7 10*3/uL (ref 1.4–6.5)
Neutrophils Relative %: 56 %
Platelets: 206 10*3/uL (ref 150–440)
RBC: 4.3 MIL/uL (ref 3.80–5.20)
RDW: 13.3 % (ref 11.5–14.5)
WBC: 10.2 10*3/uL (ref 3.6–11.0)

## 2016-09-14 LAB — TSH: TSH: 0.986 u[IU]/mL (ref 0.350–4.500)

## 2016-09-14 LAB — URINALYSIS COMPLETE WITH MICROSCOPIC (ARMC ONLY)
BACTERIA UA: NONE SEEN
BILIRUBIN URINE: NEGATIVE
GLUCOSE, UA: NEGATIVE mg/dL
HGB URINE DIPSTICK: NEGATIVE
Ketones, ur: NEGATIVE mg/dL
Leukocytes, UA: NEGATIVE
NITRITE: NEGATIVE
Protein, ur: NEGATIVE mg/dL
Specific Gravity, Urine: 1.005 (ref 1.005–1.030)
pH: 8 (ref 5.0–8.0)

## 2016-09-14 MED ORDER — HYDROCHLOROTHIAZIDE 25 MG PO TABS: 25.0000 mg | ORAL_TABLET | Freq: Every day | ORAL | 3 refills | Status: AC

## 2016-09-14 NOTE — ED Notes (Signed)
Triage by Sharrie Rothmanasey RN

## 2016-09-14 NOTE — ED Provider Notes (Signed)
[facility] Emergency Department Provider Note   ____________________________________________   First MD Initiated Contact with Patient 09/14/16 1209     (approximate)  I have reviewed the triage vital signs and the nursing notes.   HISTORY  Chief Complaint Dizziness    HPI Sylvia Kirk is a 55 y.o. female patient complaining of vertigo and headache onset yesterday. Patient denies any provocative incident for his complaint. Patient states she's we she's has hypertension and was told she needs take blood pressure medication but has not complied secondary to lack of insurance.Patient denies any vision change. Patient states there is fatigue. Patient states she was told she has mitral valve prolapse. No palliative measures taken for her complaints. Patient denies pain.   Past Medical History:  Diagnosis Date  . Emphysema lung (HCC)   . Emphysema lung (HCC)   . Hypertension   . Mitral valve prolapse     There are no active problems to display for this patient.   Past Surgical History:  Procedure Laterality Date  . ABDOMINAL HYSTERECTOMY    . CESAREAN SECTION    . NECK SURGERY    . SHOULDER ARTHROSCOPY     right    Prior to Admission medications   Medication Sig Start Date End Date Taking? Authorizing Provider  albuterol (PROVENTIL HFA;VENTOLIN HFA) 108 (90 BASE) MCG/ACT inhaler Inhale 2 puffs into the lungs every 6 (six) hours as needed for wheezing or shortness of breath. 09/28/15   Minna AntisKevin Paduchowski, MD  azithromycin (ZITHROMAX) 250 MG tablet Take 1 tablet (250 mg total) by mouth daily. 09/28/15   Minna AntisKevin Paduchowski, MD  cyclobenzaprine (FLEXERIL) 10 MG tablet Take 1 tablet (10 mg total) by mouth 3 (three) times daily as needed. 08/15/16   Joni Reiningonald K Semaj Kham, PA-C  cyclobenzaprine (FLEXERIL) 5 MG tablet Take 1 tablet (5 mg total) by mouth every 8 (eight) hours as needed for muscle spasms. 03/18/16   Jenise V Bacon Menshew, PA-C    hydrochlorothiazide (HYDRODIURIL) 25 MG tablet Take 1 tablet (25 mg total) by mouth daily. 09/14/16   Joni Reiningonald K Naz Denunzio, PA-C  ibuprofen (ADVIL,MOTRIN) 800 MG tablet Take 1 tablet (800 mg total) by mouth every 8 (eight) hours as needed. 06/20/15   Evangeline Dakinharles M Beers, PA-C  methylPREDNISolone (MEDROL DOSEPAK) 4 MG TBPK tablet Take Tapered dose as directed 08/15/16   Joni Reiningonald K Dearl Rudden, PA-C  naproxen (NAPROSYN) 500 MG tablet Take 1 tablet (500 mg total) by mouth 2 (two) times daily with a meal. 04/27/16   Joni Reiningonald K Gailya Tauer, PA-C  oxyCODONE-acetaminophen (PERCOCET) 7.5-325 MG tablet Take 1 tablet by mouth every 4 (four) hours as needed for severe pain. 04/27/16   Joni Reiningonald K Minard Millirons, PA-C  predniSONE (DELTASONE) 10 MG tablet Take 1 tablet (10 mg total) by mouth 2 (two) times daily with a meal. 03/18/16   Jenise V Bacon Menshew, PA-C  traMADol (ULTRAM) 50 MG tablet Take 1 tablet (50 mg total) by mouth every 6 (six) hours as needed. 08/15/16 08/15/17  Joni Reiningonald K Murrel Freet, PA-C    Allergies Codeine  No family history on file.  Social History Social History  Substance Use Topics  . Smoking status: Current Every Day Smoker    Packs/day: 0.50    Types: Cigarettes  . Smokeless tobacco: Never Used  . Alcohol use No    Review of Systems Constitutional: No fever/chills. Fatigue Eyes: No visual changes. ENT: No sore throat. Cardiovascular: Denies chest pain. Respiratory: Denies shortness of breath. Gastrointestinal: No abdominal pain.  No nausea, no vomiting.  No diarrhea.  No constipation. Genitourinary: Negative for dysuria. Musculoskeletal: Negative for back pain. Skin: Negative for rash. Neurological: Positive for headaches, but denies focal weakness or numbness. Endocrine:Hypertension  ____________________________________________   PHYSICAL EXAM:  VITAL SIGNS: ED Triage Vitals  Enc Vitals Group     BP 09/14/16 1151 (!) 166/95     Pulse Rate 09/14/16 1151 73     Resp 09/14/16 1151 18     Temp 09/14/16 1151  98.7 F (37.1 C)     Temp Source 09/14/16 1151 Oral     SpO2 09/14/16 1151 97 %     Weight 09/14/16 1022 127 lb (57.6 kg)     Height 09/14/16 1022 5\' 8"  (1.727 m)     Head Circumference --      Peak Flow --      Pain Score 09/14/16 1023 0     Pain Loc --      Pain Edu? --      Excl. in GC? --     Constitutional: Alert and oriented. Well appearing and in no acute distress. Eyes: Conjunctivae are normal. PERRL. EOMI. Head: Atraumatic. Nose: No congestion/rhinnorhea. Mouth/Throat: Mucous membranes are moist.  Oropharynx non-erythematous. Neck: No stridor.  No cervical spine tenderness to palpation. Hematological/Lymphatic/Immunilogical: No cervical lymphadenopathy. Cardiovascular: Normal rate, regular rhythm. Grossly normal heart sounds.  Good peripheral circulation. Respiratory: Normal respiratory effort.  No retractions. Lungs CTAB. Gastrointestinal: Soft and nontender. No distention. No abdominal bruits. No CVA tenderness. Musculoskeletal: No lower extremity tenderness nor edema.  No joint effusions. Neurologic:  Normal speech and language. No gross focal neurologic deficits are appreciated. No gait instability. Skin:  Skin is warm, dry and intact. No rash noted. Psychiatric: Mood and affect are normal. Speech and behavior are normal.  ____________________________________________   LABS (all labs ordered are listed, but only abnormal results are displayed)  Labs Reviewed  COMPREHENSIVE METABOLIC PANEL - Abnormal; Notable for the following:       Result Value   Glucose, Bld 119 (*)    Total Protein 8.2 (*)    All other components within normal limits  URINALYSIS COMPLETEWITH MICROSCOPIC ([facility] ONLY) - Abnormal; Notable for the following:    Color, Urine STRAW (*)    APPearance CLEAR (*)    Squamous Epithelial / LPF 0-5 (*)    All other components within normal limits  CBC WITH DIFFERENTIAL/PLATELET  TSH   ____________________________________________  EKG  EKG read  by heart states Dr. with no acute findings. ____________________________________________  RADIOLOGY No acute finding on chest x-ray.  ____________________________________________   PROCEDURES  Procedure(s) performed: None  Procedures  Critical Care performed: No  ____________________________________________   INITIAL IMPRESSION / ASSESSMENT AND PLAN / ED COURSE  Pertinent labs & imaging results that were available during my care of the patient were reviewed by me and considered in my medical decision making (see chart for details).  Hypertension. Patient given discharge care instructions. Patient started on HCTZ 25 mg. Patient [facility] and establish care.  Clinical Course     ____________________________________________   FINAL CLINICAL IMPRESSION(S) / ED DIAGNOSES  Final diagnoses:  Essential hypertension      NEW MEDICATIONS STARTED DURING THIS VISIT:  New Prescriptions   HYDROCHLOROTHIAZIDE (HYDRODIURIL) 25 MG TABLET    Take 1 tablet (25 mg total) by mouth daily.     Note:  This document was prepared using Conservation officer, historic buildingsDragon voice recognition software and may  include unintentional dictation errors.    Joni Reining, PA-C 09/14/16 1333    Jeanmarie Plant, MD 09/14/16 1536

## 2016-09-14 NOTE — Discharge Instructions (Signed)
1 week after starting blood pressure medication advised through the blood pressure check.

## 2016-09-14 NOTE — ED Triage Notes (Signed)
Reports dizziness since yesterday, headache.  Reports no insurance so she has not been taking bp meds. Skin w/d

## 2016-12-17 ENCOUNTER — Ambulatory Visit: Payer: Self-pay | Admitting: Unknown Physician Specialty

## 2016-12-26 ENCOUNTER — Ambulatory Visit: Payer: Self-pay | Admitting: Internal Medicine

## 2018-08-29 IMAGING — CR DG CHEST 2V
1 series · 2 of 2 positions shown · non-contrast
Comparison: 09/28/2015

CLINICAL DATA: Shortness of breath, vertigo

EXAM:
CHEST  2 VIEW

[Series 1: dg chest 2 view · 0.14mm/px · 2 of 2 slices shown]
[im 1/2]
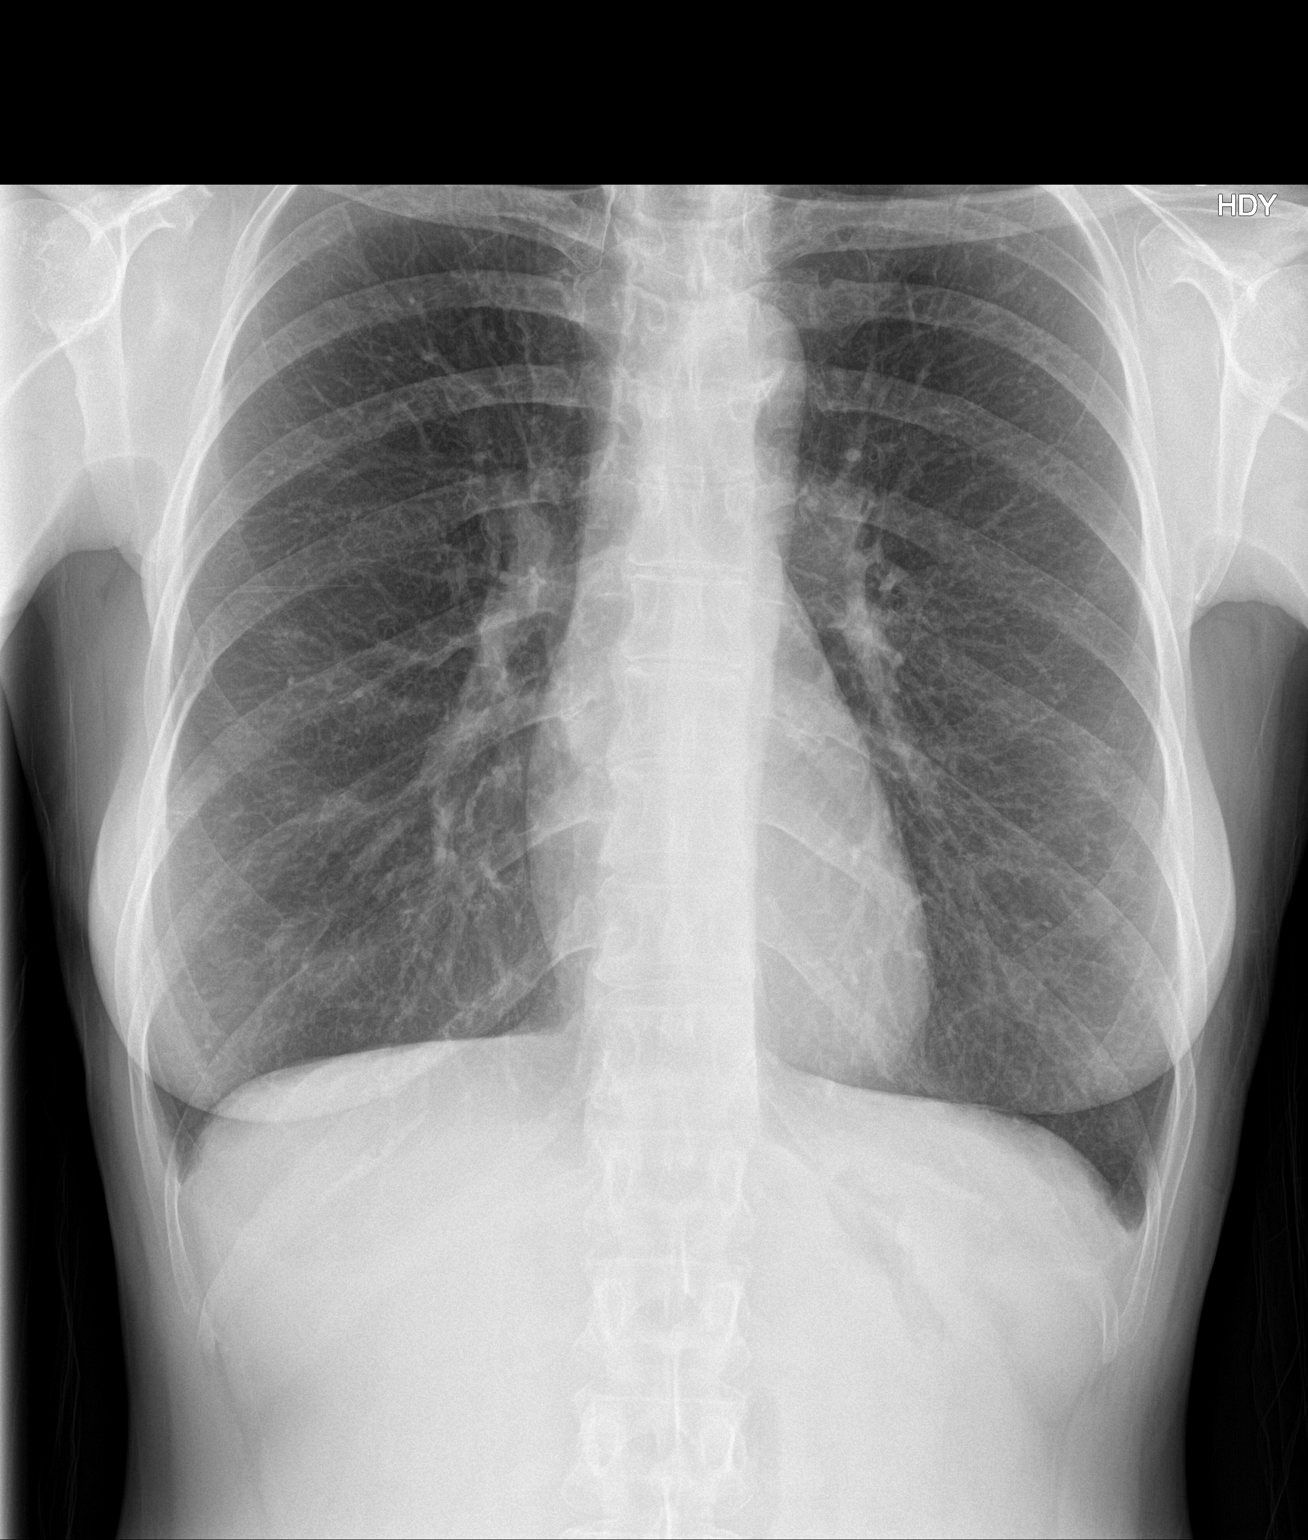
[im 2/2]
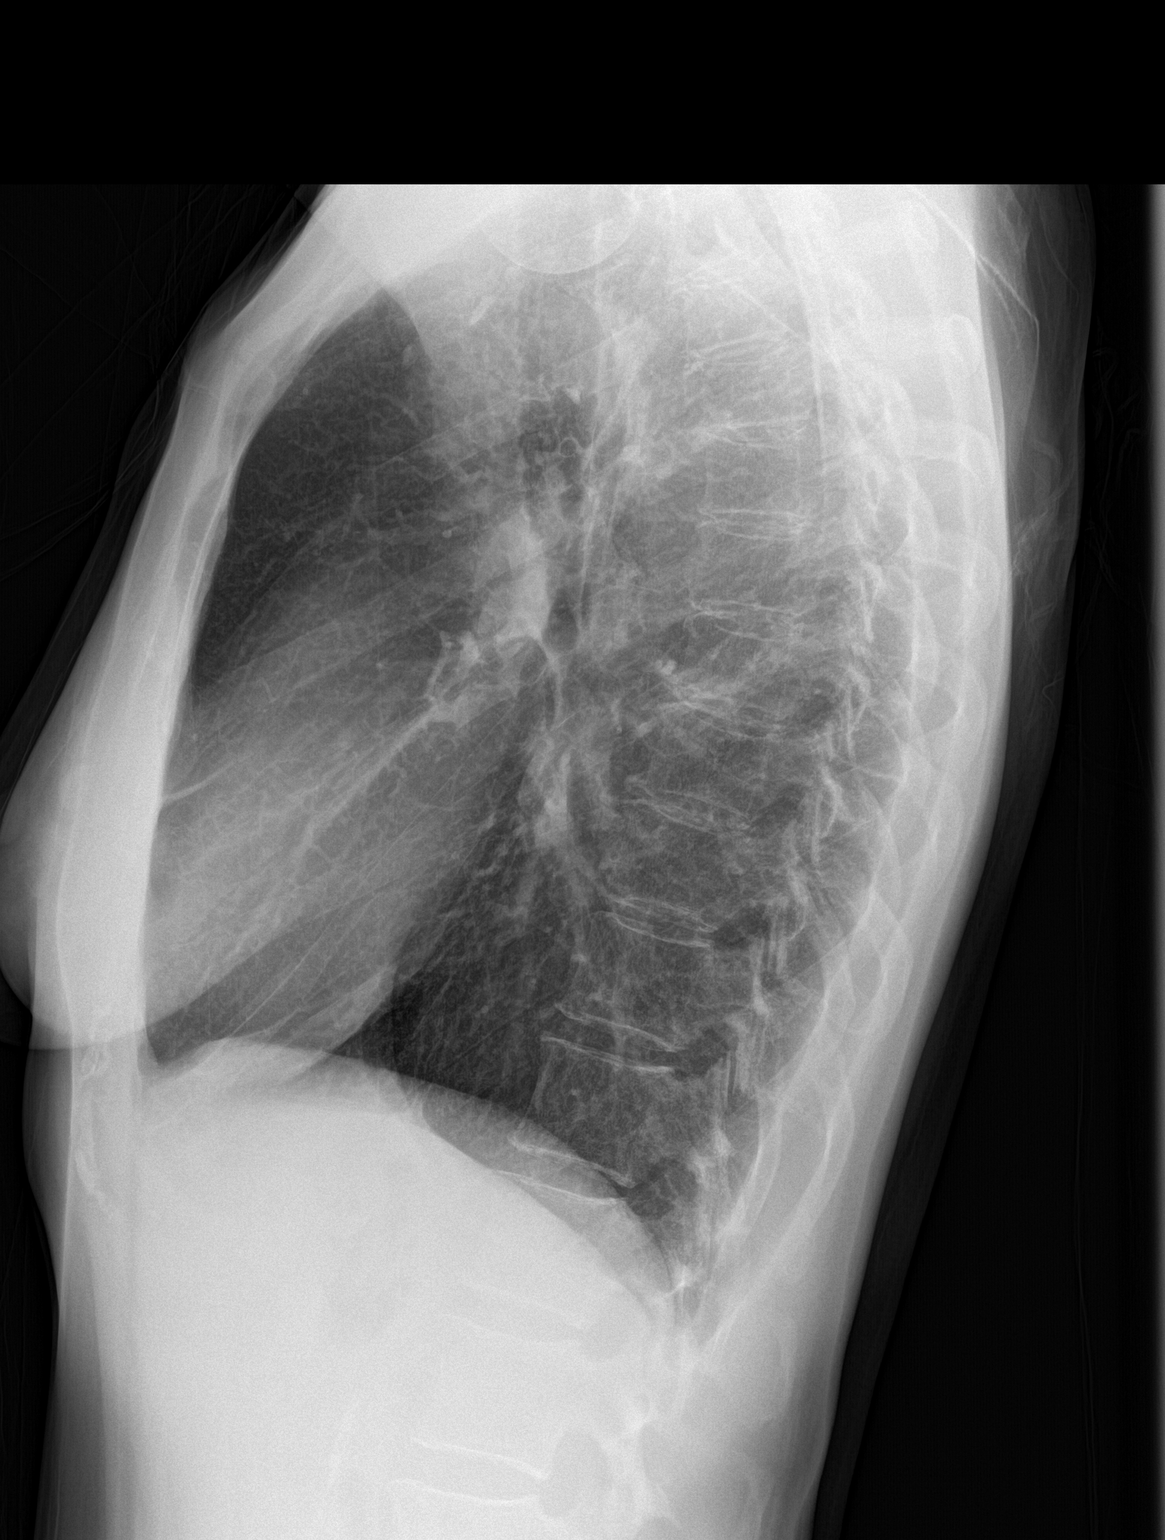

[2 of 2 positions shown; findings below may reference images not displayed]

FINDINGS: Lungs are clear.  No pleural effusion or pneumothorax.

The heart is normal in size.

Visualized osseous structures are within normal limits. Cervical
spine fixation hardware.
IMPRESSION: No evidence of acute cardiopulmonary disease.
